# Patient Record
Sex: Male | Born: 1980 | Race: White | Hispanic: No | Marital: Single | Smoking: Current every day smoker
Health system: Southern US, Community
[De-identification: ages and names within clinical notes are randomized; demographics above are authoritative.]

## PROBLEM LIST (undated history)

## (undated) DIAGNOSIS — I10 Essential (primary) hypertension: Secondary | ICD-10-CM

## (undated) HISTORY — PX: FINGER SURGERY: SHX640

---

## 2017-05-19 ENCOUNTER — Other Ambulatory Visit: Payer: Self-pay

## 2017-05-19 ENCOUNTER — Encounter (HOSPITAL_COMMUNITY): Payer: Self-pay | Admitting: *Deleted

## 2017-05-19 ENCOUNTER — Emergency Department (HOSPITAL_COMMUNITY): Payer: 59

## 2017-05-19 ENCOUNTER — Emergency Department (HOSPITAL_COMMUNITY)
Admission: EM | Admit: 2017-05-19 | Discharge: 2017-05-19 | Disposition: A | Discharge status: Home / Self Care | Payer: 59 | Attending: Emergency Medicine | Admitting: Emergency Medicine

## 2017-05-19 DIAGNOSIS — I10 Essential (primary) hypertension: Secondary | ICD-10-CM | POA: Insufficient documentation

## 2017-05-19 DIAGNOSIS — R079 Chest pain, unspecified: Secondary | ICD-10-CM | POA: Diagnosis present

## 2017-05-19 DIAGNOSIS — R0781 Pleurodynia: Secondary | ICD-10-CM | POA: Insufficient documentation

## 2017-05-19 DIAGNOSIS — R05 Cough: Secondary | ICD-10-CM | POA: Insufficient documentation

## 2017-05-19 DIAGNOSIS — R52 Pain, unspecified: Secondary | ICD-10-CM

## 2017-05-19 HISTORY — DX: Essential (primary) hypertension: I10

## 2017-05-19 NOTE — ED Notes (Signed)
Pt asked if he could leave since "Im not getting any prescriptions" but RN encouraged pt to stay to receive discharge papers. Pt waited a few minutes and then walked out prior to reviewing discharge instructions with pt

## 2017-05-19 NOTE — ED Provider Notes (Signed)
[facility]   CSN: [id number] Arrival date & time: 05/19/17  1800     History   Chief Complaint Chief Complaint  Patient presents with  . Chest Pain    HPI Keith Scott is a 36 y.o. male.  HPI    36 year old male presents today with complaints of left rib pain.  Patient notes approximately 1.5 weeks ago he had a coughing fit and felt a sharp pain under his right rib cage.  He notes this pain is worse with palpation, worse with movement, worse with deep inspiration.  He notes no associated shortness of breath, denies any history of DVT or PE or any other significant risk factors.  Patient denies any fever.  He notes ibuprofen and Tylenol minorly improved symptoms.  The patient denies any other complaints.  No history of the same.   Past Medical History:  Diagnosis Date  . Hypertension     There are no active problems to display for this patient.   History reviewed. No pertinent surgical history.     Home Medications    Prior to Admission medications   Not on File    Family History No family history on file.  Social History Social History   Tobacco Use  . Smoking status: Not on file  Substance Use Topics  . Alcohol use: Not on file  . Drug use: Not on file     Allergies   Patient has no allergy information on record.   Review of Systems Review of Systems  All other systems reviewed and are negative.    Physical Exam Updated Vital Signs BP (!) 155/102 (BP Location: Right Arm)   Pulse 93   Temp 98.8 F (37.1 C) (Oral)   Resp 18   SpO2 99%   Physical Exam  Constitutional: He is oriented to person, place, and time. He appears well-developed and well-nourished.  HENT:  Head: Normocephalic and atraumatic.  Eyes: Conjunctivae are normal. Pupils are equal, round, and reactive to light. Right eye exhibits no discharge. Left eye exhibits no discharge. No scleral icterus.  Neck: Normal  range of motion. No JVD present. No tracheal deviation present.  Pulmonary/Chest: Effort normal. No stridor.  Tenderness to palpation of left lower rib no signs of swelling or edema, no crepitus, lung sounds clear throughout-no abdominal tenderness  Neurological: He is alert and oriented to person, place, and time. Coordination normal.  Psychiatric: He has a normal mood and affect. His behavior is normal. Judgment and thought content normal.  Nursing note and vitals reviewed.    ED Treatments / Results  Labs (all labs ordered are listed, but only abnormal results are displayed) Labs Reviewed - No data to display  EKG  EKG Interpretation None       Radiology Dg Ribs Unilateral W/chest Left  Result Date: 05/19/2017 CLINICAL DATA:  Cough and chest pain, initial encounter EXAM: LEFT RIBS AND CHEST - 3+ VIEW COMPARISON:  None. FINDINGS: No fracture or other bone lesions are seen involving the ribs. There is no evidence of pneumothorax or pleural effusion. Both lungs are clear. Heart size and mediastinal contours are within normal limits. IMPRESSION: No acute abnormality noted. Electronically Signed   By: Alcide CleverMark  Lukens M.D.   On: 05/19/2017 19:23    Procedures Procedures (including critical care time)  Medications Ordered in ED Medications - No data to display   Initial Impression / Assessment and Plan / ED Course  I have reviewed  the triage vital signs and the nursing notes.  Pertinent labs & imaging results that were available during my care of the patient were reviewed by me and considered in my medical decision making (see chart for details).      Final Clinical Impressions(s) / ED Diagnoses   Final diagnoses:  Rib pain    Labs:   Imaging: DC chest 2 view  Consults:  Therapeutics:  Discharge Meds:   Assessment/Plan: 36 year old male presents today with rib pain.  This is reproducible on my exam, no signs of PE infection, or any other life-threatening etiology.   No acute fractures.  Symptomatic care instructions given strict return precautions given.  Patient verbalized understanding and agreement to today's plan had no further questions or concerns at discharge.     ED Discharge Orders    None       Rosalio LoudHedges, Ajai Terhaar, PA-C 05/19/17 2113    Shaune PollackIsaacs, Cameron, MD 05/20/17 [id number]

## 2017-05-19 NOTE — ED Triage Notes (Signed)
To ED for eval of left rib pain. Pinpoint. States this started after coughing 'really' hard last week. No SOB. No CP. Appears in NAD

## 2017-09-14 ENCOUNTER — Other Ambulatory Visit (HOSPITAL_BASED_OUTPATIENT_CLINIC_OR_DEPARTMENT_OTHER): Payer: Self-pay

## 2017-09-14 DIAGNOSIS — R0683 Snoring: Secondary | ICD-10-CM

## 2017-09-14 DIAGNOSIS — G4710 Hypersomnia, unspecified: Secondary | ICD-10-CM

## 2017-09-14 DIAGNOSIS — R5383 Other fatigue: Secondary | ICD-10-CM

## 2017-09-14 DIAGNOSIS — G4730 Sleep apnea, unspecified: Secondary | ICD-10-CM

## 2017-10-05 ENCOUNTER — Ambulatory Visit (HOSPITAL_BASED_OUTPATIENT_CLINIC_OR_DEPARTMENT_OTHER): Payer: 59 | Attending: Internal Medicine | Admitting: Internal Medicine

## 2017-10-05 VITALS — Ht 69.0 in | Wt 270.0 lb

## 2017-10-05 DIAGNOSIS — G4733 Obstructive sleep apnea (adult) (pediatric): Secondary | ICD-10-CM | POA: Diagnosis not present

## 2017-10-05 DIAGNOSIS — R5383 Other fatigue: Secondary | ICD-10-CM

## 2017-10-05 DIAGNOSIS — G4710 Hypersomnia, unspecified: Secondary | ICD-10-CM

## 2017-10-05 DIAGNOSIS — G4730 Sleep apnea, unspecified: Secondary | ICD-10-CM

## 2017-10-05 DIAGNOSIS — R0683 Snoring: Secondary | ICD-10-CM

## 2017-10-28 DIAGNOSIS — G4733 Obstructive sleep apnea (adult) (pediatric): Secondary | ICD-10-CM

## 2017-10-28 NOTE — Procedures (Signed)
   Patient Name: Keith Scott, Keith Scott Date: 10/06/2017 Gender: Male D.O.B: 09-07-1980 Age (years): 36 Referring Provider: Jamison Oka MD Height (inches): 69 Interpreting Physician: Jetty Duhamel MD, ABSM Weight (lbs): 270 RPSGT: Celene Kras BMI: 40 MRN: [id number] Neck Size: 20.00 CLINICAL INFORMATION Sleep Study Type: HST Indication for sleep study: Excessive Daytime Sleepiness, Fatigue, Snoring  Epworth Sleepiness Score: 10  SLEEP STUDY TECHNIQUE A multi-channel overnight portable sleep study was performed. The channels recorded were: nasal airflow, thoracic respiratory movement, and oxygen saturation with a pulse oximetry. Snoring was also monitored.  MEDICATIONS Patient self administered medications include: none reported.  SLEEP ARCHITECTURE Patient was studied for 446.5 minutes. The sleep efficiency was 85.9 % and the patient was supine for 4.3%. The arousal index was 0.0 per hour.  RESPIRATORY PARAMETERS The overall AHI was 12.6 per hour, with a central apnea index of 0.0 per hour.  The oxygen nadir was 79% during sleep.  CARDIAC DATA Mean heart rate during sleep was 99.9 bpm.  IMPRESSIONS - Mild obstructive sleep apnea occurred during this study (AHI = 12.6/h). - No significant central sleep apnea occurred during this study (CAI = 0.0/h). - Oxygen desaturation was noted during this study (Min O2 = 79%, Mean 91%). - Patient snored.  DIAGNOSIS - Obstructive Sleep Apnea (327.23 [G47.33 ICD-10])  RECOMMENDATIONS - Suggest CPAP titration sleep study or DME autopap. A fitted oral appliance or other options may be considered, based on clinical judgment. - Sleep hygiene should be reviewed to assess factors that may improve sleep quality. - Weight management and regular exercise should be initiated or continued.  [Electronically signed] 10/28/2017 07:58 AM  Jetty Duhamel MD, ABSM Diplomate, American Board of Sleep Medicine   NPI: [id number]                           Jetty Duhamel Diplomate, American Board of Sleep Medicine  ELECTRONICALLY SIGNED ON:  10/28/2017, 7:55 AM [facility] PH: [phone]   FX: [phone] ACCREDITED BY THE AMERICAN ACADEMY OF SLEEP MEDICINE

## 2017-11-03 ENCOUNTER — Encounter (HOSPITAL_BASED_OUTPATIENT_CLINIC_OR_DEPARTMENT_OTHER): Payer: Self-pay

## 2017-11-03 DIAGNOSIS — G4710 Hypersomnia, unspecified: Secondary | ICD-10-CM

## 2017-11-03 DIAGNOSIS — R0683 Snoring: Secondary | ICD-10-CM

## 2017-11-03 DIAGNOSIS — R5383 Other fatigue: Secondary | ICD-10-CM

## 2017-11-27 ENCOUNTER — Ambulatory Visit (HOSPITAL_BASED_OUTPATIENT_CLINIC_OR_DEPARTMENT_OTHER): Payer: 59 | Attending: Internal Medicine | Admitting: Internal Medicine

## 2017-11-27 VITALS — Ht 69.0 in | Wt 260.0 lb

## 2017-11-27 DIAGNOSIS — G4733 Obstructive sleep apnea (adult) (pediatric): Secondary | ICD-10-CM | POA: Diagnosis not present

## 2017-11-27 DIAGNOSIS — R0683 Snoring: Secondary | ICD-10-CM | POA: Diagnosis present

## 2017-11-27 DIAGNOSIS — G4710 Hypersomnia, unspecified: Secondary | ICD-10-CM | POA: Diagnosis not present

## 2017-11-27 DIAGNOSIS — Z79899 Other long term (current) drug therapy: Secondary | ICD-10-CM | POA: Diagnosis not present

## 2017-11-27 DIAGNOSIS — R5383 Other fatigue: Secondary | ICD-10-CM

## 2017-12-11 DIAGNOSIS — G4733 Obstructive sleep apnea (adult) (pediatric): Secondary | ICD-10-CM | POA: Diagnosis not present

## 2017-12-11 NOTE — Procedures (Signed)
   Patient Name: Keith Scott, Trevar Study Date: 11/27/2017 Gender: Male D.O.B: [birth year] Age (years): 36 Referring Provider: Jamison OkaMobolaji Bakare MD Height (inches): 69 Interpreting Physician: Jetty Duhamellinton Dade Rodin MD, ABSM Weight (lbs): 260 RPSGT: Lowry RamMckinney, Takeya BMI: 38 MRN: [id number] Neck Size: 20.00  CLINICAL INFORMATION The patient is referred for a CPAP titration to treat sleep apnea.  Date of NPSG, Split Night or HST:  NPSG 10/06/17  AHI 12.6/ hr, desaturation to 79%, body weight 270 lbs  SLEEP STUDY TECHNIQUE As per the AASM Manual for the Scoring of Sleep and Associated Events v2.3 (April 2016) with a hypopnea requiring 4% desaturations.  The channels recorded and monitored were frontal, central and occipital EEG, electrooculogram (EOG), submentalis EMG (chin), nasal and oral airflow, thoracic and abdominal wall motion, anterior tibialis EMG, snore microphone, electrocardiogram, and pulse oximetry. Continuous positive airway pressure (CPAP) was initiated at the beginning of the study and titrated to treat sleep-disordered breathing.  MEDICATIONS Medications self-administered by patient taken the night of the study : ALBUTEROL  TECHNICIAN COMMENTS Comments added by technician: NONE Comments added by scorer: N/A  RESPIRATORY PARAMETERS Optimal PAP Pressure (cm): 14 AHI at Optimal Pressure (/hr): 0.0 Overall Minimal O2 (%): 86.0 Supine % at Optimal Pressure (%): 100 Minimal O2 at Optimal Pressure (%): 92.0   SLEEP ARCHITECTURE The study was initiated at 10:15:18 PM and ended at 5:50:24 AM.  Sleep onset time was 5.5 minutes and the sleep efficiency was 92.3%%. The total sleep time was 420.1 minutes.  The patient spent 4.4%% of the night in stage N1 sleep, 73.8%% in stage N2 sleep, 0.0%% in stage N3 and 21.78% in REM.Stage REM latency was 174.5 minutes  Wake after sleep onset was 29.5. Alpha intrusion was absent. Supine sleep was 100.00%.  CARDIAC DATA The 2 lead EKG  demonstrated sinus rhythm. The mean heart rate was 72.3 beats per minute. Other EKG findings include: PVCs.  LEG MOVEMENT DATA The total Periodic Limb Movements of Sleep (PLMS) were 0. The PLMS index was 0.0. A PLMS index of <15 is considered normal in adults.  IMPRESSIONS - The optimal PAP pressure was 14 cm of water. - Central sleep apnea was not noted during this titration (CAI = 0.0/h). - Moderate oxygen desaturations were observed during this titration (min O2 = 86.0%). Min at CPAP 14 was 92%. - The patient snored with moderate snoring volume during this titration study. - 2-lead EKG demonstrated: PVCs - Clinically significant periodic limb movements were not noted during this study. Arousals associated with PLMs were rare.  DIAGNOSIS - Obstructive Sleep Apnea (327.23 [G47.33 ICD-10])  RECOMMENDATIONS - Trial of CPAP therapy on 14 cm H2O or DME autopap 10-20. Patient wore a Large size Fisher&Paykel Full Face Mask Simplus mask and heated humidification. - Sleep hygiene should be reviewed to assess factors that may improve sleep quality. - Weight management and regular exercise should be initiated or continued.  [Electronically signed] 12/11/2017 10:58 AM  Jetty Duhamellinton Seraphim Affinito MD, ABSM Diplomate, American Board of Sleep Medicine   NPI: [id number]                          Jetty Duhamellinton Pattie Flaharty Diplomate, American Board of Sleep Medicine  ELECTRONICALLY SIGNED ON:  12/11/2017, 10:52 AM [facility] PH: [phone]   FX: [phone] ACCREDITED BY THE AMERICAN ACADEMY OF SLEEP MEDICINE

## 2018-01-06 ENCOUNTER — Emergency Department (HOSPITAL_COMMUNITY)
Admission: EM | Admit: 2018-01-06 | Discharge: 2018-01-06 | Disposition: A | Discharge status: Home / Self Care | Payer: 59 | Attending: Emergency Medicine | Admitting: Emergency Medicine

## 2018-01-06 ENCOUNTER — Emergency Department (HOSPITAL_COMMUNITY): Payer: 59

## 2018-01-06 ENCOUNTER — Encounter (HOSPITAL_COMMUNITY): Payer: Self-pay

## 2018-01-06 ENCOUNTER — Other Ambulatory Visit: Payer: Self-pay

## 2018-01-06 DIAGNOSIS — I10 Essential (primary) hypertension: Secondary | ICD-10-CM | POA: Diagnosis not present

## 2018-01-06 DIAGNOSIS — F17210 Nicotine dependence, cigarettes, uncomplicated: Secondary | ICD-10-CM | POA: Insufficient documentation

## 2018-01-06 DIAGNOSIS — Z79899 Other long term (current) drug therapy: Secondary | ICD-10-CM | POA: Diagnosis not present

## 2018-01-06 DIAGNOSIS — R569 Unspecified convulsions: Secondary | ICD-10-CM | POA: Diagnosis present

## 2018-01-06 DIAGNOSIS — M25562 Pain in left knee: Secondary | ICD-10-CM | POA: Insufficient documentation

## 2018-01-06 LAB — URINALYSIS, ROUTINE W REFLEX MICROSCOPIC
BACTERIA UA: NONE SEEN
BILIRUBIN URINE: NEGATIVE
Glucose, UA: NEGATIVE mg/dL
Hgb urine dipstick: NEGATIVE
KETONES UR: NEGATIVE mg/dL
LEUKOCYTES UA: NEGATIVE
NITRITE: NEGATIVE
PROTEIN: 100 mg/dL — AB
Specific Gravity, Urine: 1.02 (ref 1.005–1.030)
pH: 7 (ref 5.0–8.0)

## 2018-01-06 LAB — RAPID URINE DRUG SCREEN, HOSP PERFORMED
Amphetamines: NOT DETECTED
Barbiturates: NOT DETECTED
Benzodiazepines: NOT DETECTED
Cocaine: NOT DETECTED
OPIATES: NOT DETECTED
Tetrahydrocannabinol: POSITIVE — AB

## 2018-01-06 LAB — COMPREHENSIVE METABOLIC PANEL
ALK PHOS: 55 U/L (ref 38–126)
ALT: 27 U/L (ref 0–44)
AST: 34 U/L (ref 15–41)
Albumin: 4.2 g/dL (ref 3.5–5.0)
Anion gap: 7 (ref 5–15)
BUN: 15 mg/dL (ref 6–20)
CALCIUM: 9 mg/dL (ref 8.9–10.3)
CHLORIDE: 105 mmol/L (ref 98–111)
CO2: 29 mmol/L (ref 22–32)
CREATININE: 0.91 mg/dL (ref 0.61–1.24)
GFR calc Af Amer: >60 mL/min (ref >60)
Glucose, Bld: 109 mg/dL — ABNORMAL HIGH (ref 70–99)
Potassium: 4.4 mmol/L (ref 3.5–5.1)
Sodium: 141 mmol/L (ref 135–145)
Total Bilirubin: 0.4 mg/dL (ref 0.3–1.2)
Total Protein: 7.6 g/dL (ref 6.5–8.1)

## 2018-01-06 LAB — CBC WITH DIFFERENTIAL/PLATELET
BASOS PCT: 0 %
Basophils Absolute: 0 10*3/uL (ref 0.0–0.1)
EOS ABS: 0.2 10*3/uL (ref 0.0–0.7)
EOS PCT: 2 %
HCT: 49 % (ref 39.0–52.0)
Hemoglobin: 16.9 g/dL (ref 13.0–17.0)
LYMPHS ABS: 3 10*3/uL (ref 0.7–4.0)
Lymphocytes Relative: 34 %
MCH: 32.2 pg (ref 26.0–34.0)
MCHC: 34.5 g/dL (ref 30.0–36.0)
MCV: 93.3 fL (ref 78.0–100.0)
MONOS PCT: 5 %
Monocytes Absolute: 0.5 10*3/uL (ref 0.1–1.0)
Neutro Abs: 5.1 10*3/uL (ref 1.7–7.7)
Neutrophils Relative %: 59 %
PLATELETS: 234 10*3/uL (ref 150–400)
RBC: 5.25 MIL/uL (ref 4.22–5.81)
RDW: 12.8 % (ref 11.5–15.5)
WBC: 8.7 10*3/uL (ref 4.0–10.5)

## 2018-01-06 LAB — CBG MONITORING, ED: GLUCOSE-CAPILLARY: 128 mg/dL — AB (ref 70–99)

## 2018-01-06 LAB — TROPONIN I

## 2018-01-06 MED ORDER — SODIUM CHLORIDE 0.9 % IV BOLUS
1000.0000 mL | Freq: Once | INTRAVENOUS | Status: AC
  Administered 2018-01-06: 1000 mL via INTRAVENOUS

## 2018-01-06 MED ORDER — IBUPROFEN 800 MG PO TABS
800.0000 mg | ORAL_TABLET | Freq: Once | ORAL | Status: DC
  Filled 2018-01-06: qty 1

## 2018-01-06 MED ORDER — LEVETIRACETAM 500 MG PO TABS
1500.0000 mg | ORAL_TABLET | Freq: Once | ORAL | Status: AC
  Administered 2018-01-06: 1500 mg via ORAL
  Filled 2018-01-06: qty 3

## 2018-01-06 MED ORDER — LEVETIRACETAM 750 MG PO TABS: 750.0000 mg | ORAL_TABLET | Freq: Two times a day (BID) | ORAL | 0 refills | Status: AC

## 2018-01-06 NOTE — ED Provider Notes (Signed)
No current facility-administered medications for this encounter.   Current Outpatient Medications:  .  albuterol (PROVENTIL HFA;VENTOLIN HFA) 108 (90 Base) MCG/ACT inhaler, 2 puffs in to the lung every 4 to 6 hours as needed for wheezing and shortness of breath, Disp: , Rfl:  .  atorvastatin (LIPITOR) 10 MG tablet, Take 10 mg by mouth daily., Disp: , Rfl:  .  carisoprodol (SOMA) 350 MG tablet, Take 350 mg by mouth 3 (three) times daily., Disp: , Rfl:  .  cetirizine (ZYRTEC) 10 MG tablet, Take 10 mg by mouth daily., Disp: , Rfl:  .  DULoxetine (CYMBALTA) 60 MG capsule, Take 120 mg by mouth daily., Disp: , Rfl:  .  Fluticasone-Salmeterol (ADVAIR) 250-50 MCG/DOSE AEPB, Inhale 1 puff into the lungs daily. , Disp: , Rfl:  .  ibuprofen (ADVIL,MOTRIN) 200 MG tablet, Take 800 mg by mouth 3 (three) times daily as needed (pain)., Disp: , Rfl:  .  lisinopril (PRINIVIL,ZESTRIL) 40 MG tablet, Take 40 mg by mouth daily., Disp: , Rfl:  .  LYRICA 200 MG capsule, Take 200 mg by mouth 3 (three) times daily., Disp: , Rfl:  .  ondansetron (ZOFRAN-ODT) 4 MG disintegrating tablet, Take 4 mg by mouth daily as needed., Disp: , Rfl:    Pt returned from Ct.   Radiologist reports no acute abnormality on scan.  Pt feels better now.  Pt reports knee is sore.    I discussed episode with pt.  His employer(Boss) witness seizure episode 2 years ago and today and reports episodes were the same.  Pt's wife reports pt spilled flour everywhere this am but otherwise he seemed normal.  I suspect pt did have a seizure.  I spoke to Dr. Laurence SlateAroor who advised Keppra 1.5 grams.   Start Keppra 750 mg bid. Pt needs to follow up with Neurology for EEG.  Pt advised to not drive or operate equipment for 6 months.    Elson AreasSofia, Shaterica Mcclatchy K, Cordelia Poche-C 01/06/18 1825    Tilden Fossaees, Elizabeth, MD 01/11/18 [id number]

## 2018-01-06 NOTE — ED Triage Notes (Addendum)
Pt arrives via POV. Pt reports he was at work today when he had a witnessed seizure. Pt reports that he had 1 seizure 2 years ago but has not had one since until today. Pt reports the seizure happened about 2 hrs ago. Pt is not currently on any medications for seizure control. Pt bit lip during seizure

## 2018-01-06 NOTE — ED Provider Notes (Signed)
[facility]-EMERGENCY DEPT Provider Note   CSN: [id number] Arrival date & time: 01/06/18  1233     History   Chief Complaint Chief Complaint  Patient presents with  . Seizures    HPI Keith Scott is a 37 y.o. male.  37 year old male smoker with a past medical history of seizures, hypertension sent to the ED s/p witness seizure x 4 hours ago.  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  Patient had a previous seizure 2 years ago but was never placed on medication, he has not followed up with the neurologist.  He is complaining of some left elbow pain and upper inner lip pain.  Patient states he felt some tingling in his legs this morning prior to going to work.  Patient also reports him and his wife are trying a new low-carb diet and he did not have breakfast this morning.  Patient currently feels shaky, tired.  He denies any recent sickness, headache, abdominal complaints, chest pain or shortness of breath.     Past Medical History:  Diagnosis Date  . Hypertension     There are no active problems to display for this patient.   Past Surgical History:  Procedure Laterality Date  . FINGER SURGERY          Home Medications    Prior to Admission medications   Medication Sig Start Date End Date Taking? Authorizing Provider  albuterol (PROVENTIL HFA;VENTOLIN HFA) 108 (90 Base) MCG/ACT inhaler 2 puffs in to the lung every 4 to 6 hours as needed for wheezing and shortness of breath 12/19/17  Yes [provider]  atorvastatin (LIPITOR) 10 MG tablet Take 10 mg by mouth daily. 12/20/17  Yes [provider]  carisoprodol (SOMA) 350 MG tablet Take 350 mg by mouth 3 (three) times daily. 12/31/17  Yes [provider]  cetirizine (ZYRTEC) 10 MG tablet Take 10 mg by mouth daily. 12/19/17  Yes [provider]  DULoxetine (CYMBALTA) 60 MG capsule Take 120 mg by mouth daily. 12/19/17  Yes [provider]  Fluticasone-Salmeterol (ADVAIR) 250-50 MCG/DOSE AEPB Inhale 1 puff into the lungs daily.  12/19/17  Yes [provider]  ibuprofen (ADVIL,MOTRIN) 200 MG tablet Take 800 mg by mouth 3 (three) times daily as needed (pain).   Yes [provider]  lisinopril (PRINIVIL,ZESTRIL) 40 MG tablet Take 40 mg by mouth daily. 12/19/17  Yes [provider]  LYRICA 200 MG capsule Take 200 mg by mouth 3 (three) times daily. 01/05/18  Yes [provider]  ondansetron (ZOFRAN-ODT) 4 MG disintegrating tablet Take 4 mg by mouth daily as needed. 12/21/17  Yes [provider]    Family History History reviewed. No pertinent family history.  Social History Social History   Tobacco Use  . Smoking status: Current Every Day Smoker    Packs/day: 1.00  . Smokeless tobacco: Never Used  Substance Use Topics  . Alcohol use: Not Currently  . Drug use: Yes    Types: Marijuana     Allergies   Patient has no known allergies.   Review of Systems Review of Systems  Constitutional: Negative for chills and fever.  HENT: Negative for ear pain and sore throat.   Eyes: Negative for pain and visual disturbance.  Respiratory: Negative for cough, shortness of breath and  wheezing.   Cardiovascular: Negative for chest pain and palpitations.  Gastrointestinal: Negative for abdominal pain, diarrhea, nausea and vomiting.  Genitourinary: Negative for dysuria and hematuria.  Musculoskeletal: Negative for arthralgias, back pain, neck pain and neck stiffness.  Skin: Negative for color change and rash.  Neurological: Positive for seizures and weakness. Negative for syncope, speech difficulty, light-headedness and headaches.  All other systems reviewed and are negative.    Physical Exam Updated Vital Signs BP (!) 144/95   Pulse 91   Temp 99 F (37.2 C) (Oral)   Resp  (!) 22   Wt 115.7 kg (255 lb)   SpO2 99%   BMI 37.66 kg/m   Physical Exam  Constitutional: He is oriented to person, place, and time. He appears well-developed and well-nourished.  HENT:  Head: Normocephalic and atraumatic. Head is without laceration.  Mouth/Throat: Oropharynx is clear and moist and mucous membranes are normal. Normal dentition. No lacerations. No oropharyngeal exudate, posterior oropharyngeal edema, posterior oropharyngeal erythema or tonsillar abscesses.  Patient exhibits an abrasion on the upper inner lip region, no surgical repair at this time. Poor dentition present.   Eyes: Pupils are equal, round, and reactive to light. No scleral icterus.  Neck: Normal range of motion.  Cardiovascular: Normal heart sounds.  Pulmonary/Chest: Effort normal and breath sounds normal. He has no wheezes. He exhibits no tenderness.  Abdominal: Soft. Bowel sounds are normal. He exhibits no distension. There is no tenderness.  Musculoskeletal: He exhibits no tenderness or deformity.  Neurological: He is alert and oriented to person, place, and time. He has normal strength. He is not disoriented. No cranial nerve deficit. He exhibits normal muscle tone. Coordination normal. GCS eye subscore is 4. GCS verbal subscore is 5. GCS motor subscore is 6.  CN II-CN XII intact. Patient looks sluggish and tired at the moment.   Skin: Skin is warm and dry. Abrasion noted. No bruising noted. There is erythema.     Nursing note and vitals reviewed.    ED Treatments / Results  Labs (all labs ordered are listed, but only abnormal results are displayed) Labs Reviewed  COMPREHENSIVE METABOLIC PANEL - Abnormal; Notable for the following components:      Result Value   Glucose, Bld 109 (*)    All other components within normal limits  CBG MONITORING, ED - Abnormal; Notable for the following components:   Glucose-Capillary 128 (*)    All other components within normal limits  CBC WITH  DIFFERENTIAL/PLATELET  RAPID URINE DRUG SCREEN, HOSP PERFORMED  URINALYSIS, ROUTINE W REFLEX MICROSCOPIC  TROPONIN I    EKG None  Radiology No results found.  Procedures Procedures (including critical care time)  Medications Ordered in ED Medications  sodium chloride 0.9 % bolus 1,000 mL (0 mLs Intravenous Stopped 01/06/18 1608)     Initial Impression / Assessment and Plan / ED Course  I have reviewed the triage vital signs and the nursing notes.  Pertinent labs & imaging results that were available during my care of the patient were reviewed by me and considered in my medical decision making (see chart for details).     Patient reports to the ED s/p seizure. Patient's EKG on file, no previous for comparison will order troponin to r/o any cardiac pathology. CBC showed no leukocytosis, all other within normal limits. Patient is currently getting hydrate with fluids for rehydration. Will order CT Head to r/o any intracranial mass or bleeding. Patient request a nicotine patch at this time I have  denied that request, as patient was originally tachycardic and hypertension.   When reassessing patient he states he is having pain on his left knee which took most of the hit. I have added knee imaging.   4:26 PM Care transferred to Langston MaskerKaren Sofia PA at shift change.   Final Clinical Impressions(s) / ED Diagnoses   Final diagnoses:  Seizure [facility])    ED Discharge Orders    None       Claude MangesSoto, Tangi Shroff, PA-C 01/06/18 1628    Tilden Fossaees, Elizabeth, MD 01/11/18 1546

## 2018-01-06 NOTE — ED Notes (Signed)
Patient transported to X-ray 

## 2018-01-06 NOTE — Discharge Instructions (Addendum)
Return if any problems.  Schedule to see neurology for evaluation. No driving or operating equipment for 6 months or until released by neurology to do so.

## 2018-03-13 ENCOUNTER — Other Ambulatory Visit: Payer: Self-pay | Admitting: Neurology

## 2018-03-13 DIAGNOSIS — R569 Unspecified convulsions: Secondary | ICD-10-CM

## 2018-03-16 ENCOUNTER — Ambulatory Visit (HOSPITAL_COMMUNITY)
Admission: RE | Admit: 2018-03-16 | Discharge: 2018-03-16 | Disposition: A | Discharge status: Home / Self Care | Payer: 59 | Source: Ambulatory Visit | Attending: Neurology | Admitting: Neurology

## 2018-03-16 DIAGNOSIS — R569 Unspecified convulsions: Secondary | ICD-10-CM

## 2018-03-16 NOTE — Procedures (Addendum)
History: 36 yo M being evaluated for seizure-like episodes  Sedation: None  Technique: This is a 21 channel routine scalp EEG performed at the bedside with bipolar and monopolar montages arranged in accordance to the international 10/20 system of electrode placement. One channel was dedicated to EKG recording.    Background: The background consists of intermixed alpha and beta activities. There is a well defined posterior dominant rhythm of 10-11 hz that attenuates with eye opening.  Drowsiness was evidenced by anterior shifting of the posterior dominant rhythm, but sleep was not recorded.  Photic stimulation: Physiologic driving is present  EEG Abnormalities: None  Clinical Interpretation: This normal EEG is recorded in the waking and drowsy state. There was no seizure or seizure predisposition recorded on this study. Please note that a normal EEG does not preclude the possibility of epilepsy.   Ritta Slot, MD [facility] [phone]  If 7pm- 7am, please page neurology on call as listed in AMION.

## 2018-03-16 NOTE — Progress Notes (Signed)
EEG Completed; Results Pending  

## 2018-06-20 ENCOUNTER — Ambulatory Visit
Admission: RE | Admit: 2018-06-20 | Discharge: 2018-06-20 | Disposition: A | Discharge status: Home / Self Care | Payer: 59 | Source: Ambulatory Visit | Attending: Internal Medicine | Admitting: Internal Medicine

## 2018-06-20 ENCOUNTER — Other Ambulatory Visit: Payer: Self-pay | Admitting: Internal Medicine

## 2018-06-20 DIAGNOSIS — R52 Pain, unspecified: Secondary | ICD-10-CM

## 2018-06-26 ENCOUNTER — Other Ambulatory Visit: Payer: Self-pay | Admitting: Internal Medicine

## 2018-06-27 ENCOUNTER — Other Ambulatory Visit: Payer: Self-pay | Admitting: Internal Medicine

## 2018-06-27 DIAGNOSIS — R2241 Localized swelling, mass and lump, right lower limb: Secondary | ICD-10-CM

## 2018-10-26 IMAGING — CT CT HEAD W/O CM
3 series · 15 of 47 positions shown, 18 images · non-contrast
Comparison: None

CLINICAL DATA: Seizure while at work. Patient fell face forward
hitting the front of his head.

EXAM:
CT HEAD WITHOUT CONTRAST
TECHNIQUE: Contiguous axial images were obtained from the base of the skull
through the vertex without intravenous contrast.

[Series 2: head wo · axial · 0.49mm/px · z∈[-154,-29]mm · 9 of 31 slices shown, 12 images]
[im 3/31  brain]
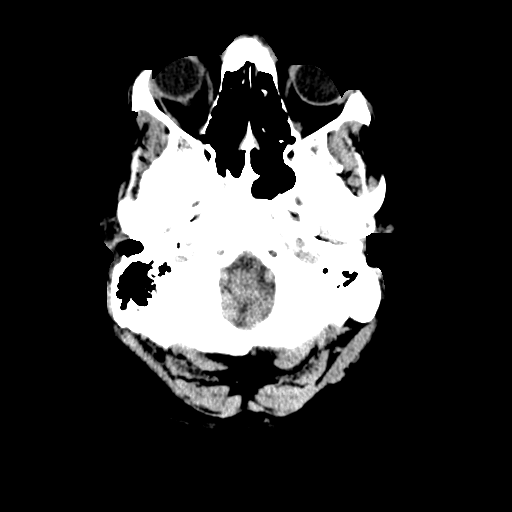
[im 3/31  bone]
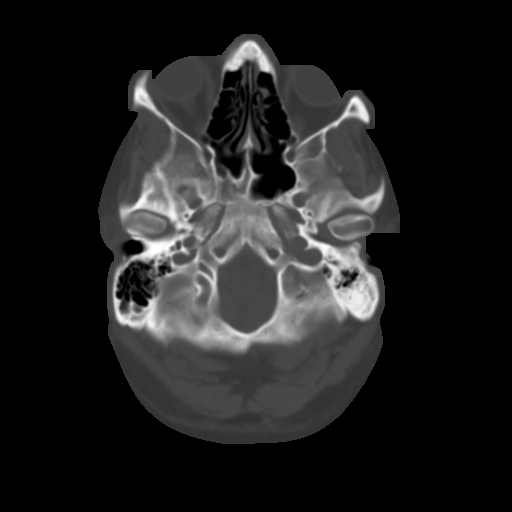
[im 6/31  brain]
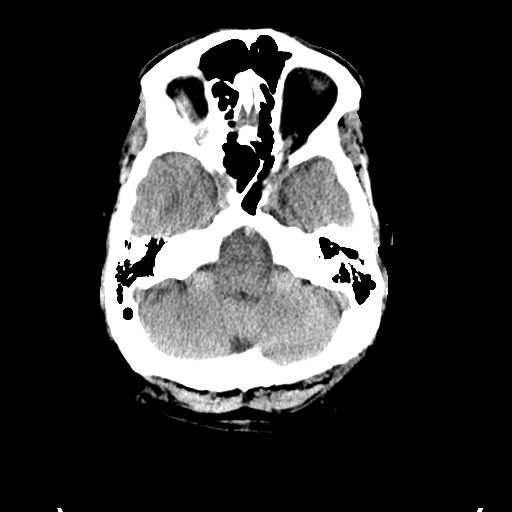
[im 9/31  brain]
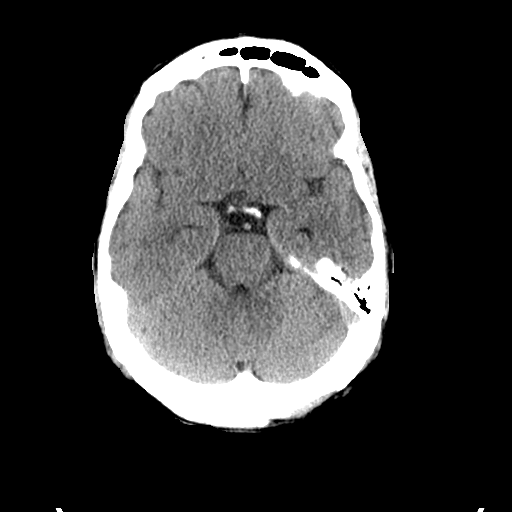
[im 12/31  brain]
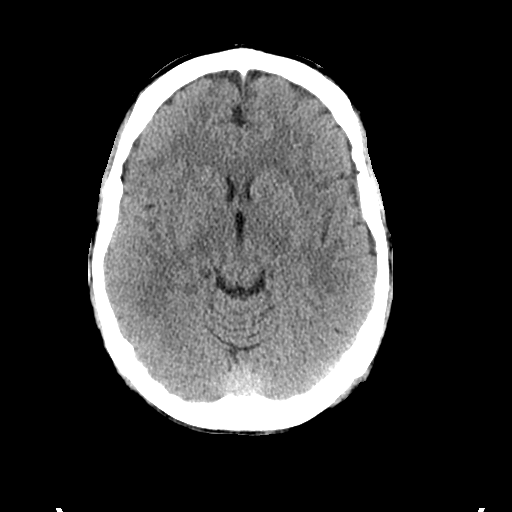
[im 16/31  brain]
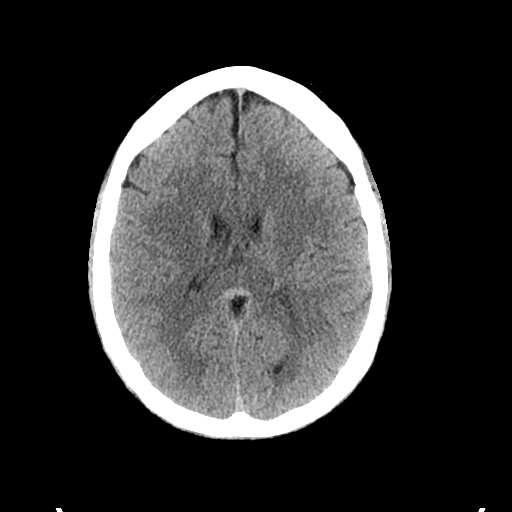
[im 16/31  bone]
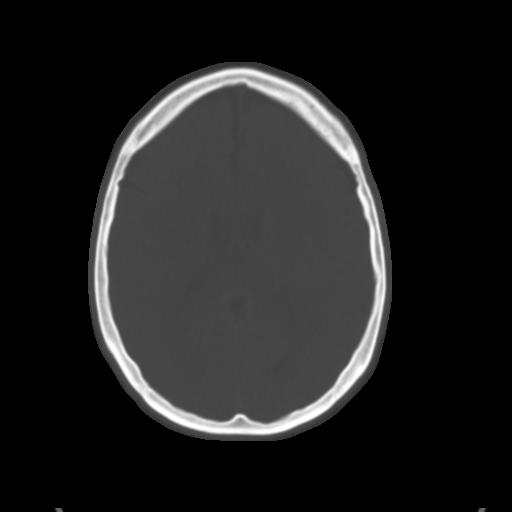
[im 19/31  brain]
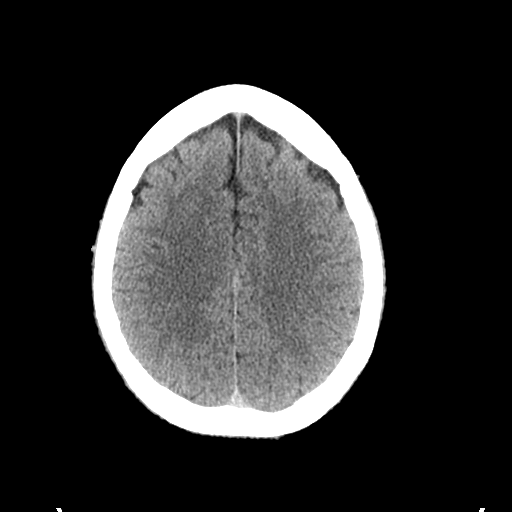
[im 22/31  brain]
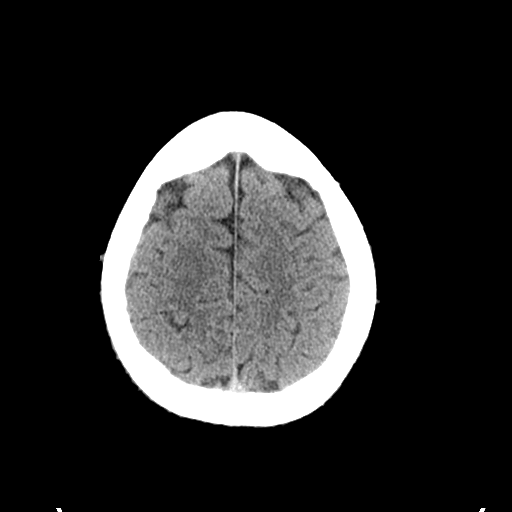
[im 25/31  brain]
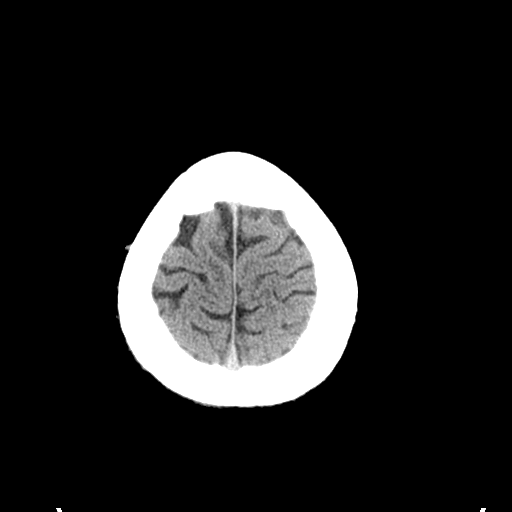
[im 28/31  brain]
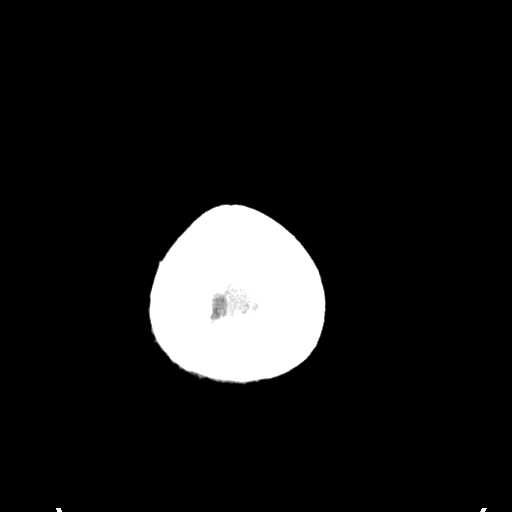
[im 28/31  bone]
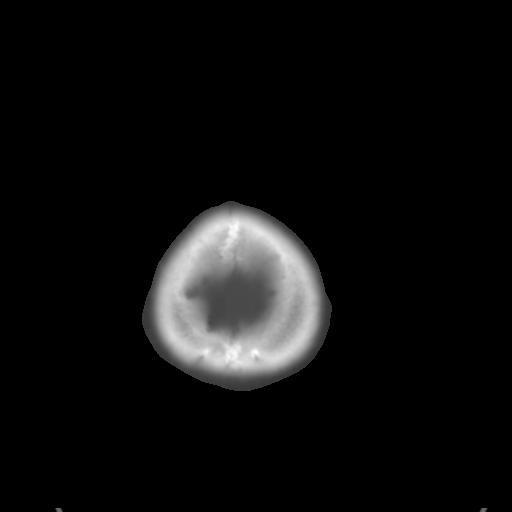

[Series 4: coronal soft tissue · coronal · 0.29mm/px · 3 of 65 slices shown]
[im 22/65  brain]
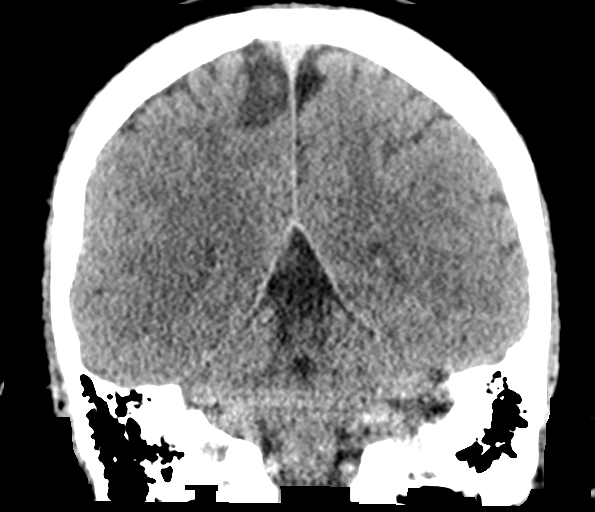
[im 29/65  brain]
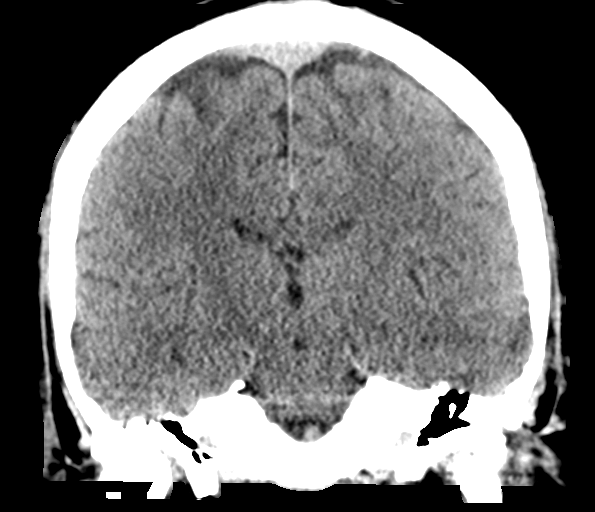
[im 36/65  brain]
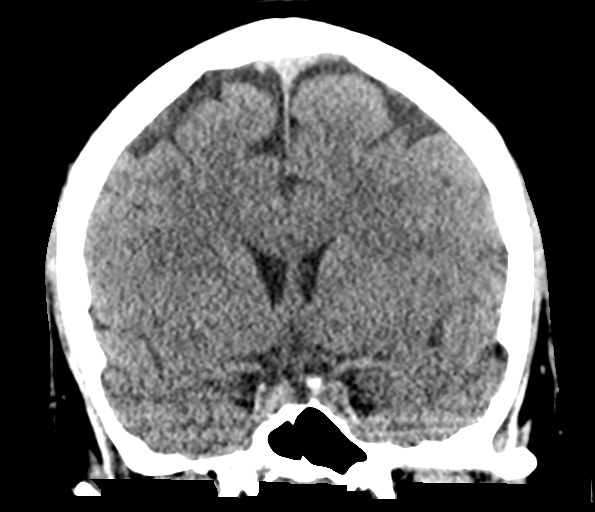

[Series 5: sagittal soft tissue · sagittal · 0.30mm/px · 3 of 47 slices shown]
[im 16/47  brain]
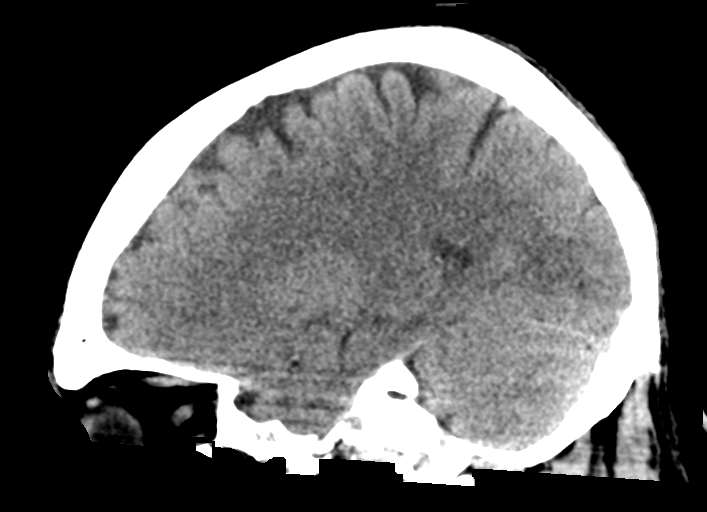
[im 24/47  brain]
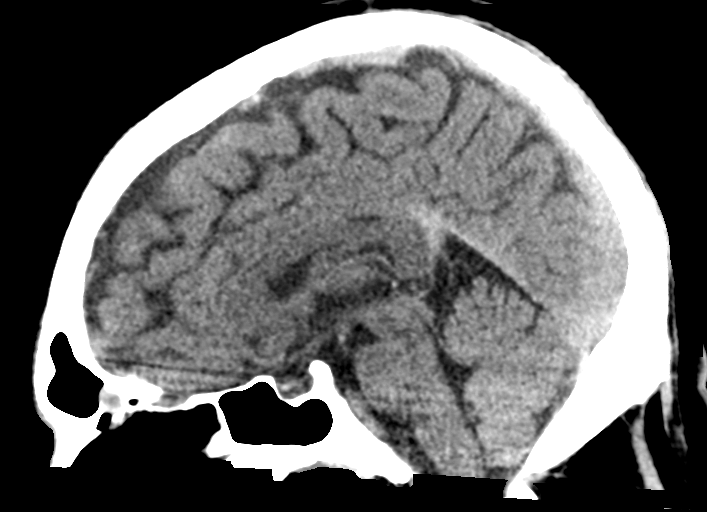
[im 31/47  brain]
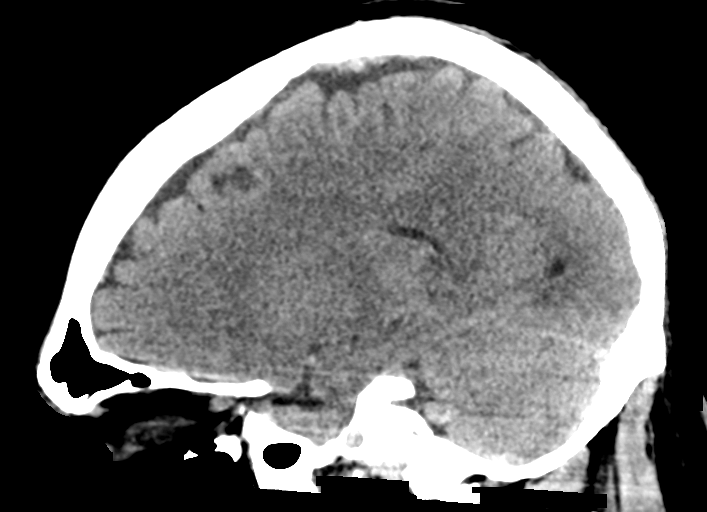

[15 of 47 positions shown; findings below may reference images not displayed]

FINDINGS: Brain: No evidence of acute infarction, hemorrhage, hydrocephalus,
extra-axial collection or mass lesion/mass effect.

Vascular: No hyperdense vessel or unexpected calcification.

Skull: Normal. Negative for fracture or focal lesion.

Sinuses/Orbits: Normal globes and orbits. Clear visualized sinuses
and mastoid air cells.

Other: None.
IMPRESSION: Normal enhanced CT scan of the brain.

## 2018-10-26 IMAGING — DX DG KNEE 1-2V*L*
2 series · 2 of 2 positions shown · non-contrast
Comparison: None.

CLINICAL DATA: Left knee pain after falling while at work.

EXAM:
LEFT KNEE - 1-2 VIEW

[knee ap]
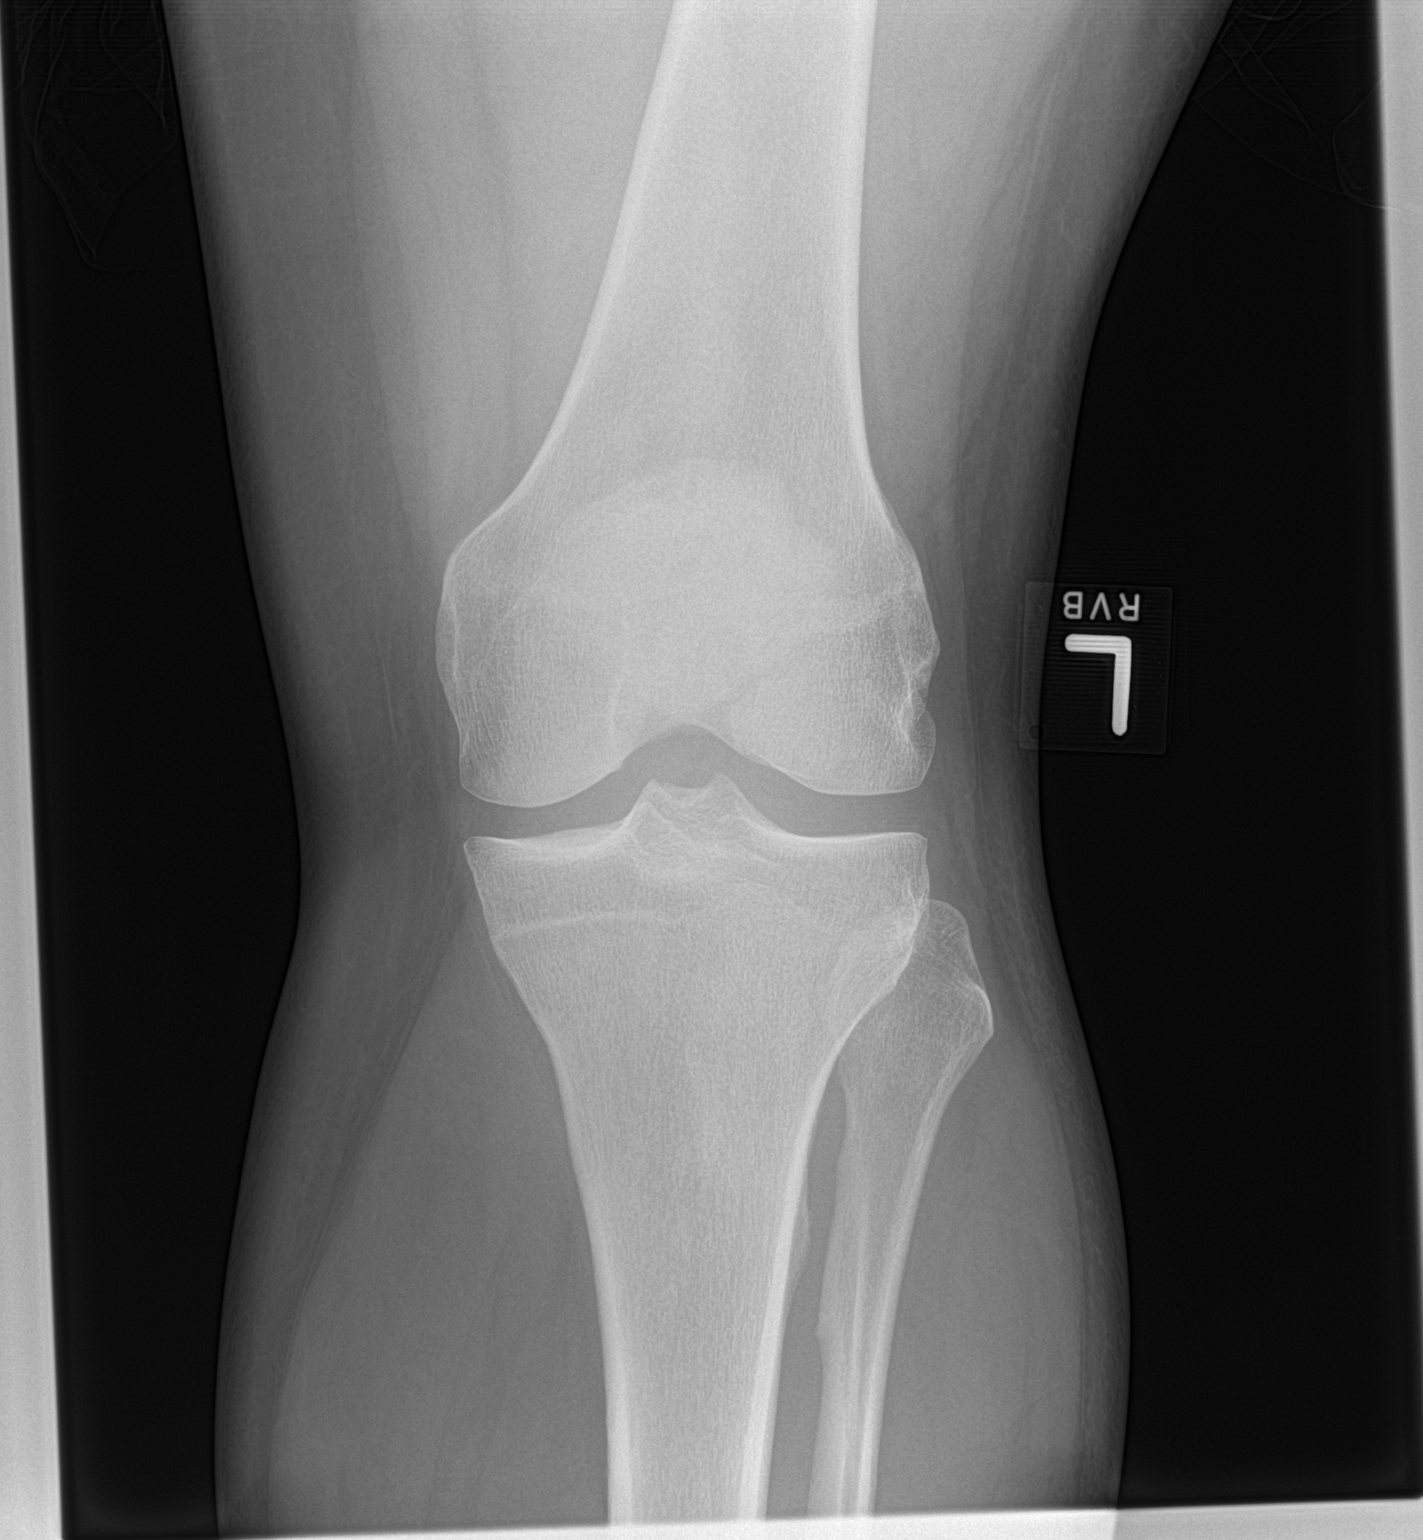

[knee lat]
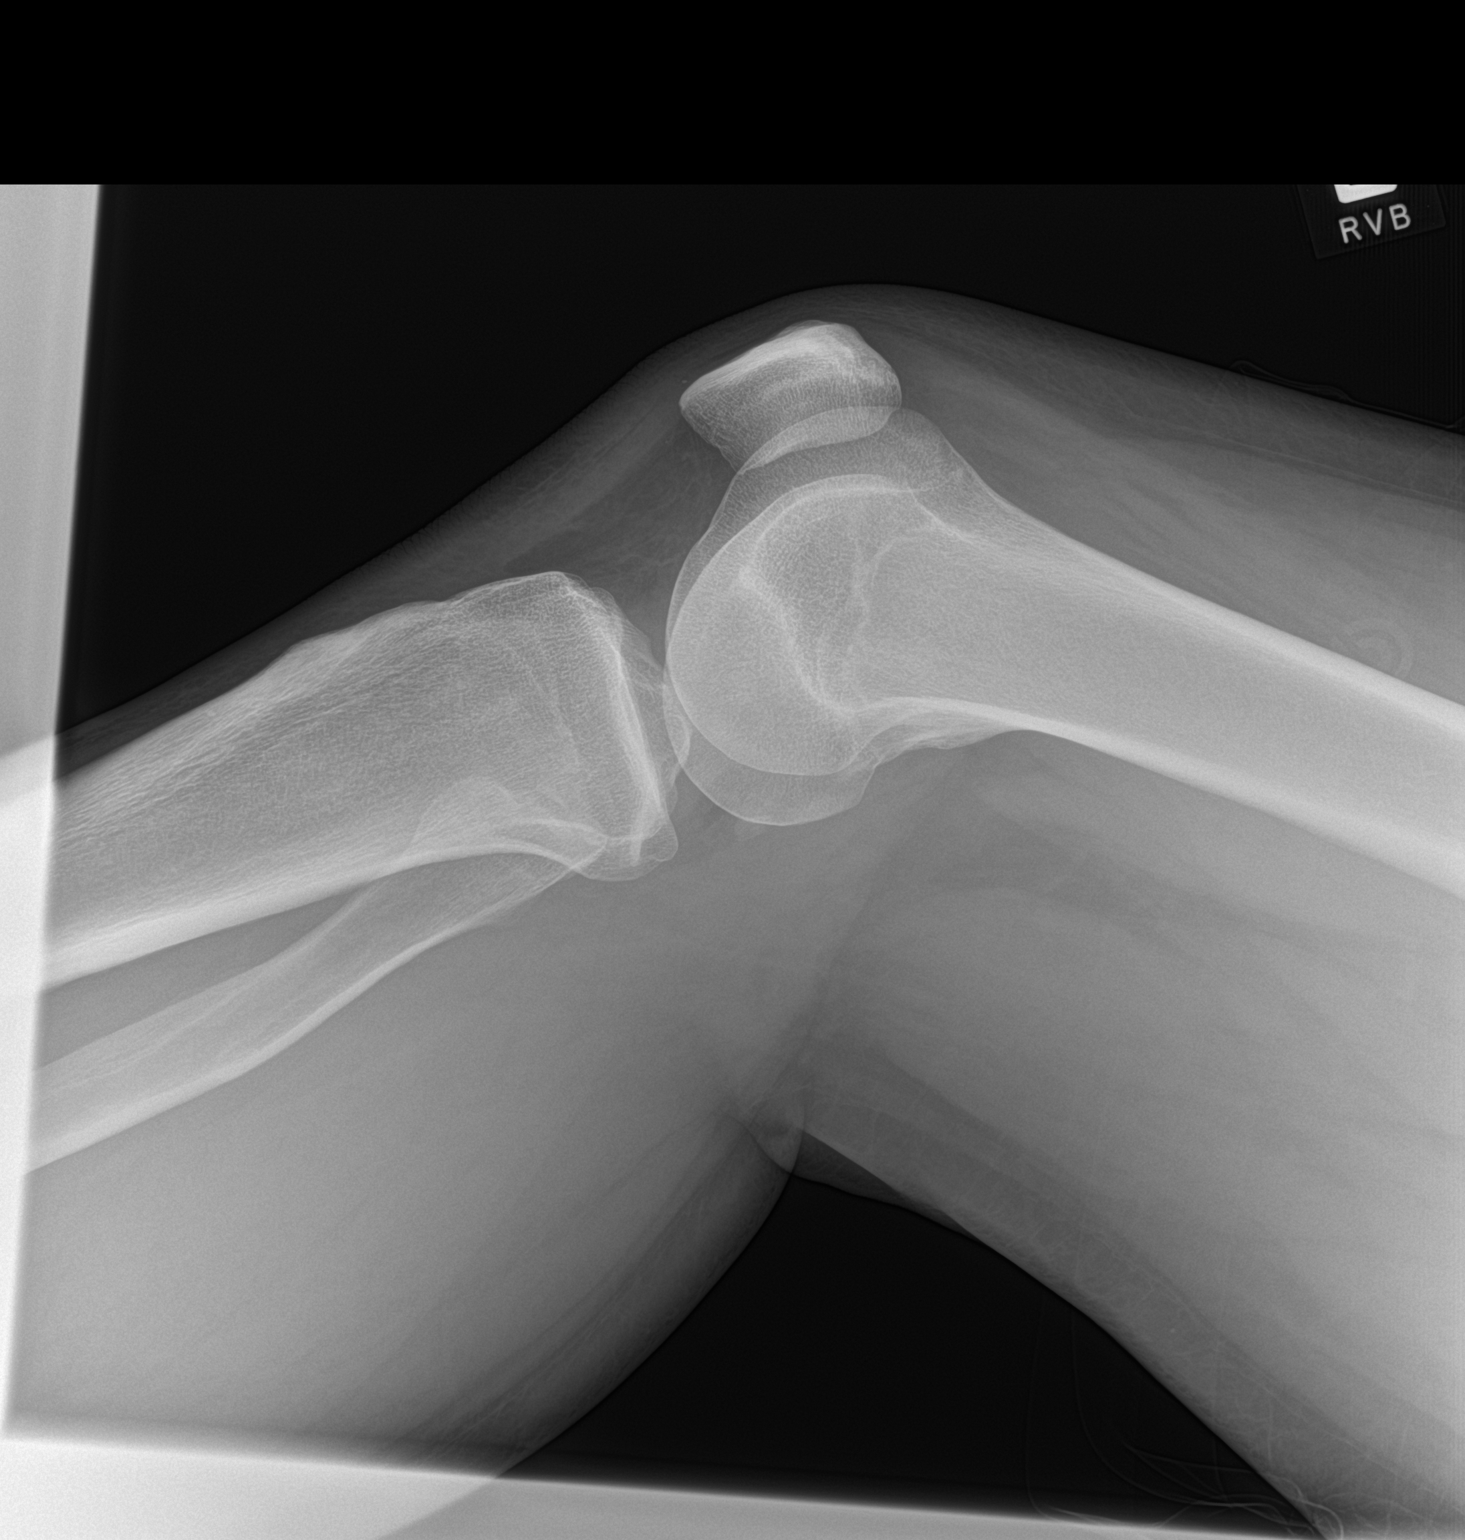

[2 of 2 positions shown; findings below may reference images not displayed]

FINDINGS: No evidence of fracture, dislocation, or joint effusion. No evidence
of arthropathy or other focal bone abnormality. Soft tissues are
unremarkable.
IMPRESSION: Negative.

## 2019-03-27 ENCOUNTER — Other Ambulatory Visit: Payer: Self-pay

## 2019-03-27 DIAGNOSIS — Z20822 Contact with and (suspected) exposure to covid-19: Secondary | ICD-10-CM

## 2019-03-29 LAB — NOVEL CORONAVIRUS, NAA: SARS-CoV-2, NAA: NOT DETECTED

## 2019-03-29 LAB — SPECIMEN STATUS REPORT

## 2020-01-02 ENCOUNTER — Encounter (HOSPITAL_BASED_OUTPATIENT_CLINIC_OR_DEPARTMENT_OTHER): Payer: Self-pay

## 2020-01-02 DIAGNOSIS — G4733 Obstructive sleep apnea (adult) (pediatric): Secondary | ICD-10-CM

## 2020-03-11 ENCOUNTER — Other Ambulatory Visit: Payer: Self-pay

## 2020-03-11 ENCOUNTER — Ambulatory Visit (HOSPITAL_BASED_OUTPATIENT_CLINIC_OR_DEPARTMENT_OTHER): Payer: 59 | Attending: Internal Medicine | Admitting: Internal Medicine

## 2020-03-11 DIAGNOSIS — G4733 Obstructive sleep apnea (adult) (pediatric): Secondary | ICD-10-CM | POA: Diagnosis present

## 2020-03-21 DIAGNOSIS — G4733 Obstructive sleep apnea (adult) (pediatric): Secondary | ICD-10-CM | POA: Diagnosis not present

## 2020-03-22 NOTE — Procedures (Signed)
    Patient Name: Keith Scott, Keith Scott Date: 03/11/2020 Gender: Male D.O.B: 04-04-1981 Age (years): 38 Referring Provider: Jamison Oka MD Height (inches): 69 Interpreting Physician: Jetty Duhamel MD, ABSM Weight (lbs): 280 RPSGT: Neeriemer, Holly BMI: 41 MRN: [id number] Neck Size: 18.50  CLINICAL INFORMATION Sleep Study Type: HST Indication for sleep study: OSA Epworth Sleepiness Score: 14  SLEEP STUDY TECHNIQUE A multi-channel overnight portable sleep study was performed. The channels recorded were: nasal airflow, thoracic respiratory movement, and oxygen saturation with a pulse oximetry. Snoring was also monitored.  MEDICATIONS Patient self administered medications include: ALBUTEROL.  SLEEP ARCHITECTURE Patient was studied for 372 minutes. The sleep efficiency was 98.5 % and the patient was supine for 33.9%. The arousal index was 0.0 per hour.  RESPIRATORY PARAMETERS The overall AHI was 13.2 per hour, with a central apnea index of 0.0 per hour. The oxygen nadir was 81% during sleep.  CARDIAC DATA Mean heart rate during sleep was 78.7 bpm.  IMPRESSIONS - Mild obstructive sleep apnea occurred during this study (AHI = 13.2/h). - No significant central sleep apnea occurred during this study (CAI = 0.0/h). - Moderate oxygen desaturation was noted during this study (Min O2 = 81%). Mean sat 92%. - Patient snored.  DIAGNOSIS - Obstructive Sleep Apnea (G47.33)  RECOMMENDATIONS - Suggest CPAP titration sleep study or autopap. Other options, including a fitted oral appliance or ENT evaluation, would be based on clinical judgment. - Be careful with alcohol, sedatives and other CNS depressants that may worsen sleep apnea and disrupt normal sleep architecture. - Sleep hygiene should be reviewed to assess factors that may improve sleep quality. - Weight management and regular exercise should be initiated or continued.  [Electronically signed] 03/22/2020 10:35  AM  Jetty Duhamel MD, ABSM Diplomate, American Board of Sleep Medicine   NPI: [id number]                        Jetty Duhamel Diplomate, American Board of Sleep Medicine  ELECTRONICALLY SIGNED ON:  03/22/2020, 10:32 AM [facility] PH: [phone]   FX: [phone] ACCREDITED BY THE AMERICAN ACADEMY OF SLEEP MEDICINE

## 2021-10-21 ENCOUNTER — Ambulatory Visit: Payer: 59 | Admitting: Dermatology

## 2022-01-27 ENCOUNTER — Encounter (HOSPITAL_COMMUNITY): Payer: Self-pay | Admitting: Emergency Medicine

## 2022-01-27 ENCOUNTER — Emergency Department (HOSPITAL_COMMUNITY)
Admission: EM | Admit: 2022-01-27 | Discharge: 2022-01-28 | Disposition: A | Discharge status: Home / Self Care | Payer: 59 | Attending: Emergency Medicine | Admitting: Emergency Medicine

## 2022-01-27 ENCOUNTER — Other Ambulatory Visit: Payer: Self-pay

## 2022-01-27 ENCOUNTER — Emergency Department (HOSPITAL_COMMUNITY): Payer: 59

## 2022-01-27 DIAGNOSIS — Z79899 Other long term (current) drug therapy: Secondary | ICD-10-CM | POA: Diagnosis not present

## 2022-01-27 DIAGNOSIS — R2242 Localized swelling, mass and lump, left lower limb: Secondary | ICD-10-CM | POA: Diagnosis present

## 2022-01-27 DIAGNOSIS — M25461 Effusion, right knee: Secondary | ICD-10-CM | POA: Insufficient documentation

## 2022-01-27 DIAGNOSIS — L97819 Non-pressure chronic ulcer of other part of right lower leg with unspecified severity: Secondary | ICD-10-CM | POA: Insufficient documentation

## 2022-01-27 DIAGNOSIS — I872 Venous insufficiency (chronic) (peripheral): Secondary | ICD-10-CM

## 2022-01-27 DIAGNOSIS — I87332 Chronic venous hypertension (idiopathic) with ulcer and inflammation of left lower extremity: Secondary | ICD-10-CM | POA: Diagnosis not present

## 2022-01-27 DIAGNOSIS — L97829 Non-pressure chronic ulcer of other part of left lower leg with unspecified severity: Secondary | ICD-10-CM | POA: Insufficient documentation

## 2022-01-27 DIAGNOSIS — I87331 Chronic venous hypertension (idiopathic) with ulcer and inflammation of right lower extremity: Secondary | ICD-10-CM | POA: Insufficient documentation

## 2022-01-27 LAB — COMPREHENSIVE METABOLIC PANEL
ALT: 24 U/L (ref 0–44)
AST: 38 U/L (ref 15–41)
Albumin: 3.7 g/dL (ref 3.5–5.0)
Alkaline Phosphatase: 49 U/L (ref 38–126)
Anion gap: 8 (ref 5–15)
BUN: 21 mg/dL — ABNORMAL HIGH (ref 6–20)
CO2: 28 mmol/L (ref 22–32)
Calcium: 8.8 mg/dL — ABNORMAL LOW (ref 8.9–10.3)
Chloride: 103 mmol/L (ref 98–111)
Creatinine, Ser: 1.14 mg/dL (ref 0.61–1.24)
GFR, Estimated: >60 mL/min (ref >60)
Glucose, Bld: 106 mg/dL — ABNORMAL HIGH (ref 70–99)
Potassium: 3.9 mmol/L (ref 3.5–5.1)
Sodium: 139 mmol/L (ref 135–145)
Total Bilirubin: 0.4 mg/dL (ref 0.3–1.2)
Total Protein: 7.1 g/dL (ref 6.5–8.1)

## 2022-01-27 LAB — DIFFERENTIAL
Abs Immature Granulocytes: 0.02 10*3/uL (ref 0.00–0.07)
Basophils Absolute: 0 10*3/uL (ref 0.0–0.1)
Basophils Relative: 1 %
Eosinophils Absolute: 0.4 10*3/uL (ref 0.0–0.5)
Eosinophils Relative: 6 %
Immature Granulocytes: 0 %
Lymphocytes Relative: 42 %
Lymphs Abs: 2.5 10*3/uL (ref 0.7–4.0)
Monocytes Absolute: 0.6 10*3/uL (ref 0.1–1.0)
Monocytes Relative: 10 %
Neutro Abs: 2.4 10*3/uL (ref 1.7–7.7)
Neutrophils Relative %: 41 %

## 2022-01-27 LAB — CBC
HCT: 44.8 % (ref 39.0–52.0)
Hemoglobin: 14.8 g/dL (ref 13.0–17.0)
MCH: 31.6 pg (ref 26.0–34.0)
MCHC: 33 g/dL (ref 30.0–36.0)
MCV: 95.7 fL (ref 80.0–100.0)
Platelets: 151 10*3/uL (ref 150–400)
RBC: 4.68 MIL/uL (ref 4.22–5.81)
RDW: 12.8 % (ref 11.5–15.5)
WBC: 5.9 10*3/uL (ref 4.0–10.5)
nRBC: 0 % (ref 0.0–0.2)

## 2022-01-27 LAB — TROPONIN I (HIGH SENSITIVITY)
Troponin I (High Sensitivity): 2 ng/L (ref <18)
Troponin I (High Sensitivity): <2 ng/L (ref <18)

## 2022-01-27 LAB — BRAIN NATRIURETIC PEPTIDE: B Natriuretic Peptide: 12.5 pg/mL (ref 0.0–100.0)

## 2022-01-27 MED ORDER — IPRATROPIUM-ALBUTEROL 0.5-2.5 (3) MG/3ML IN SOLN: 3.0000 mL | Freq: Once | RESPIRATORY_TRACT | Status: DC

## 2022-01-27 NOTE — ED Provider Triage Note (Signed)
Emergency Medicine Provider Triage Evaluation Note  Keith Scott , a 41 y.o. male  was evaluated in triage.  Pt complains of lower extremity swelling and right knee pain.  Patient states that he has noticed increased swelling in his right leg over the past day today and a half.  He noticed swelling in his left lower leg since yesterday.  He is also noticed pain in his right knee and described as sharp and increased with movement.  Denies history of heart failure, DVT/PE, recent surgery, immobilization.  He states he works on his feet all day.  Denies any known mechanism of injury.  Denies any shortness of breath or chest pain, fever, chills, abdominal pain, nausea, vomiting denies history of heart failure..  Review of Systems  Positive: See above Negative:   Physical Exam  BP (!) 140/91 (BP Location: Left Arm)   Pulse 91   Temp 97.9 F (36.6 C) (Oral)   Resp 20   SpO2 95%  Gen:   Awake, no distress   Resp:  Normal effort  MSK:   Moves extremities without difficulty  Other:  2+ pitting edema noted on bilateral lower extremities.  Patient has multiple areas of scabbed over skin where he states he has been picking at mosquito bites.  Patient has no pain with passive flexion of bilateral ankles.  Skin is very taut bilateral lower extremities.   Audible wheeze auscultated throughout lung fields.  Mild rales noted in middle lung fields.  Medical Decision Making  Medically screening exam initiated at 5:47 PM.  Appropriate orders placed.  Ramsey Guadamuz Barman was informed that the remainder of the evaluation will be completed by another provider, this initial triage assessment does not replace that evaluation, and the importance of remaining in the ED until their evaluation is complete.     Peter Garter, [place] 01/27/22 1749

## 2022-01-27 NOTE — ED Triage Notes (Signed)
Pt reports calf swelling and tightness to bilateral calfs. Pt reports knee swelling to right knee. Pt also reports warmness and redness of bilateral legs.

## 2022-01-28 ENCOUNTER — Emergency Department (HOSPITAL_COMMUNITY): Payer: 59

## 2022-01-28 MED ORDER — FUROSEMIDE 20 MG PO TABS: 20.0000 mg | ORAL_TABLET | Freq: Every day | ORAL | 0 refills | Status: AC | PRN

## 2022-01-28 MED ORDER — CELECOXIB 200 MG PO CAPS: 200.0000 mg | ORAL_CAPSULE | Freq: Every day | ORAL | 0 refills | Status: AC | PRN

## 2022-01-28 NOTE — ED Provider Notes (Signed)
[facility]-EMERGENCY DEPT Provider Note   CSN: [id number] Arrival date & time: 01/27/22  1719     History  Chief Complaint  Patient presents with   Leg Swelling   Joint Swelling    Keith Scott is a 41 y.o. male.  Pt BL leg swelling. Hx of same but "never this bad."  He has bilateral.  He has tightness in his lower extremities, he has noticed a little bit of redness and has some discomfort in the lower extremities not described as pain.  Patient also has swelling in his right knee.  He has a history of "knee issues" in both knees worse on the right.  He has noticed swelling for the past 3 days which has been progressively worsening.  He denies any mechanical symptoms such as locking, catching or feelings of instability.  No known injuries.  Patient denies chest pain shortness of breath.   The history is provided by the patient.  Knee Pain Location:  Knee Time since incident:  3 days Injury: no   Knee location:  R knee Pain details:    Quality:  Aching and pressure   Radiates to:  Does not radiate   Severity:  Moderate   Onset quality:  Gradual   Timing:  Constant   Progression:  Worsening Chronicity:  Recurrent Dislocation: no   Prior injury to area:  No Relieved by:  Nothing Worsened by:  Bearing weight, extension, flexion and activity Ineffective treatments:  None tried Associated symptoms: decreased ROM, stiffness and swelling   Risk factors: obesity   Leg Pain Location:  Leg Leg location:  L lower leg and R lower leg Pain details:    Quality:  Pressure   Radiates to:  Does not radiate   Severity:  Moderate   Onset quality:  Gradual   Duration:  3 days   Timing:  Constant   Progression:  Worsening Chronicity:  New Prior injury to area:  No Relieved by:  None tried Associated symptoms: decreased ROM, stiffness and swelling   Risk factors: obesity        Home Medications Prior to Admission medications   Medication Sig Start  Date End Date Taking? Authorizing Provider  albuterol (PROVENTIL HFA;VENTOLIN HFA) 108 (90 Base) MCG/ACT inhaler 2 puffs in to the lung every 4 to 6 hours as needed for wheezing and shortness of breath 12/19/17   [provider]  atorvastatin (LIPITOR) 10 MG tablet Take 10 mg by mouth daily. 12/20/17   [provider]  carisoprodol (SOMA) 350 MG tablet Take 350 mg by mouth 3 (three) times daily. 12/31/17   [provider]  cetirizine (ZYRTEC) 10 MG tablet Take 10 mg by mouth daily. 12/19/17   [provider]  DULoxetine (CYMBALTA) 60 MG capsule Take 120 mg by mouth daily. 12/19/17   [provider]  Fluticasone-Salmeterol (ADVAIR) 250-50 MCG/DOSE AEPB Inhale 1 puff into the lungs daily.  12/19/17   [provider]  ibuprofen (ADVIL,MOTRIN) 200 MG tablet Take 800 mg by mouth 3 (three) times daily as needed (pain).    [provider]  levETIRAcetam (KEPPRA) 750 MG tablet Take 1 tablet (750 mg total) by mouth 2 (two) times daily. 01/06/18   Elson Areas, PA-C  lisinopril (PRINIVIL,ZESTRIL) 40 MG tablet Take 40 mg by mouth daily. 12/19/17   [provider]  LYRICA 200 MG capsule Take 200 mg by mouth 3 (three) times daily. 01/05/18   [provider]  ondansetron (  ZOFRAN-ODT) 4 MG disintegrating tablet Take 4 mg by mouth daily as needed. 12/21/17   [provider]      Allergies    Patient has no known allergies.    Review of Systems   Review of Systems  Musculoskeletal:  Positive for stiffness.    Physical Exam Updated Vital Signs BP (!) 143/89   Pulse 65   Temp (!) 97.3 F (36.3 C) (Oral)   Resp 13   SpO2 96%  Physical Exam Vitals and nursing note reviewed.  Constitutional:      General: He is not in acute distress.    Appearance: He is well-developed. He is not diaphoretic.  HENT:     Head: Normocephalic and atraumatic.  Eyes:     General: No scleral icterus.    Conjunctiva/sclera: Conjunctivae normal.   Cardiovascular:     Rate and Rhythm: Normal rate and regular rhythm.     Heart sounds: Normal heart sounds.  Pulmonary:     Effort: Pulmonary effort is normal. No respiratory distress.     Breath sounds: Normal breath sounds.  Abdominal:     Palpations: Abdomen is soft.     Tenderness: There is no abdominal tenderness.  Musculoskeletal:        General: Swelling present.     Cervical back: Normal range of motion and neck supple.     Right lower leg: Edema present.     Left lower leg: Edema present.     Comments: Right knee with effusion, no redness heat or warmth noted, range of motion limited due to pain and swelling.  Skin:    General: Skin is warm and dry.     Comments: Bilateral lower extremity swelling appears equal.  There is venous stasis tattooing noted in bilateral lower extremities.  No severe redness however there is some mild restlessness suggestive of stasis dermatitis.  Neurological:     Mental Status: He is alert.  Psychiatric:        Behavior: Behavior normal.     ED Results / Procedures / Treatments   Labs (all labs ordered are listed, but only abnormal results are displayed) Labs Reviewed  COMPREHENSIVE METABOLIC PANEL - Abnormal; Notable for the following components:      Result Value   Glucose, Bld 106 (*)    BUN 21 (*)    Calcium 8.8 (*)    All other components within normal limits  BRAIN NATRIURETIC PEPTIDE  CBC  DIFFERENTIAL  CBC WITH DIFFERENTIAL/PLATELET  TROPONIN I (HIGH SENSITIVITY)  TROPONIN I (HIGH SENSITIVITY)    EKG EKG Interpretation  Date/Time:  Thursday January 28 2022 00:54:05 EDT Ventricular Rate:  68 PR Interval:  180 QRS Duration: 101 QT Interval:  415 QTC Calculation: 442 R Axis:   26 Text Interpretation: Sinus rhythm Confirmed by Nicanor Alcon, April ([id number]) on 01/28/2022 12:56:33 AM  Radiology DG Chest 2 View  Result Date: 01/27/2022 CLINICAL DATA:  Dyspnea EXAM: CHEST - 2 VIEW COMPARISON:  Radiographs 05/19/2017 FINDINGS: No  focal consolidation, pleural effusion, or pneumothorax. Normal cardiomediastinal silhouette. No acute osseous abnormality. IMPRESSION: No active cardiopulmonary disease. Electronically Signed   By: Minerva Fester M.D.   On: 01/27/2022 18:34    Procedures Procedures    Medications Ordered in ED Medications  ipratropium-albuterol (DUONEB) 0.5-2.5 (3) MG/3ML nebulizer solution 3 mL (3 mLs Nebulization Not Given 01/27/22 1908)    ED Course/ Medical Decision Making/ A&P  Medical Decision Making 41 year old obese male who presents to the emergency department with bilateral lower extremity edema and knee pain.  Patient has skin changes suggestive of chronic edema including stasis tattooing. Differential diagnosis for peripheral edema includes heart failure, hypoproteinemia, liver disease, lymphedema, venous insufficiency.  After review of all data points I suspect that this is venous insufficiency swelling with associated stasis dermatitis in the setting of obesity. No evidence of cellulitis.  Patient also has a large joint effusion on the right likely reactive likely reactive.  No evidence of septic arthritis or gout.  Patient given Ace wrap, anti-inflammatories, Lasix and outpatient referral to PCP and Ortho.  I reviewed patient's labs no acute findings I ordered visualized and interpreted right knee x-ray which shows large effusion  Amount and/or Complexity of Data Reviewed Radiology: ordered and independent interpretation performed.  Risk Prescription drug management.           Final Clinical Impression(s) / ED Diagnoses Final diagnoses:  None    Rx / DC Orders ED Discharge Orders     None         Arthor Captain, PA-C 01/28/22 1658    Palumbo, April, MD 01/28/22 2318

## 2022-01-28 NOTE — Discharge Instructions (Addendum)
Contact a health care provider if: Your condition does not improve with treatment. Your condition gets worse. You have a fever. Get help right away if: You notice red streaks coming from the affected area. Your bone or joint underneath the affected area becomes painful after the skin has healed. The affected area turns darker. You feel a deep pain in your leg or groin. You are short of breath.
